# Patient Record
Sex: Male | Born: 1955 | Race: White | Hispanic: No | Marital: Married | Smoking: Never smoker
Health system: Southern US, Community
[De-identification: ages and names within clinical notes are randomized; demographics above are authoritative.]

## PROBLEM LIST (undated history)

## (undated) DIAGNOSIS — I700 Atherosclerosis of aorta: Secondary | ICD-10-CM

## (undated) DIAGNOSIS — E7841 Elevated Lipoprotein(a): Secondary | ICD-10-CM

## (undated) DIAGNOSIS — E782 Mixed hyperlipidemia: Secondary | ICD-10-CM

## (undated) DIAGNOSIS — I10 Essential (primary) hypertension: Secondary | ICD-10-CM

## (undated) HISTORY — DX: Essential (primary) hypertension: I10

## (undated) HISTORY — DX: Mixed hyperlipidemia: E78.2

## (undated) HISTORY — DX: Atherosclerosis of aorta: I70.0

## (undated) HISTORY — DX: Elevated lipoprotein(a): E78.41

## (undated) HISTORY — PX: REPAIR KNEE LIGAMENT: SUR1188

---

## 2016-05-06 ENCOUNTER — Other Ambulatory Visit: Payer: Self-pay | Admitting: Emergency Medicine

## 2016-05-06 ENCOUNTER — Emergency Department (HOSPITAL_COMMUNITY)
Admission: EM | Admit: 2016-05-06 | Discharge: 2016-05-06 | Disposition: A | Discharge status: Home / Self Care | Payer: Self-pay | Attending: Emergency Medicine | Admitting: Emergency Medicine

## 2016-05-06 ENCOUNTER — Ambulatory Visit (INDEPENDENT_AMBULATORY_CARE_PROVIDER_SITE_OTHER): Payer: Self-pay | Admitting: Emergency Medicine

## 2016-05-06 ENCOUNTER — Encounter (HOSPITAL_COMMUNITY): Payer: Self-pay | Admitting: Vascular Surgery

## 2016-05-06 ENCOUNTER — Ambulatory Visit (INDEPENDENT_AMBULATORY_CARE_PROVIDER_SITE_OTHER): Payer: Self-pay

## 2016-05-06 VITALS — BP 154/94 | HR 65 | Temp 98.1°F | Resp 18 | Ht 69.0 in | Wt 182.8 lb

## 2016-05-06 DIAGNOSIS — I10 Essential (primary) hypertension: Secondary | ICD-10-CM

## 2016-05-06 DIAGNOSIS — Z1159 Encounter for screening for other viral diseases: Secondary | ICD-10-CM

## 2016-05-06 DIAGNOSIS — R142 Eructation: Secondary | ICD-10-CM

## 2016-05-06 DIAGNOSIS — R9431 Abnormal electrocardiogram [ECG] [EKG]: Secondary | ICD-10-CM

## 2016-05-06 DIAGNOSIS — Z13220 Encounter for screening for lipoid disorders: Secondary | ICD-10-CM

## 2016-05-06 DIAGNOSIS — R0602 Shortness of breath: Secondary | ICD-10-CM

## 2016-05-06 LAB — BASIC METABOLIC PANEL
ANION GAP: 8 (ref 5–15)
BUN: 10 mg/dL (ref 6–20)
CO2: 26 mmol/L (ref 22–32)
Calcium: 9.3 mg/dL (ref 8.9–10.3)
Chloride: 104 mmol/L (ref 101–111)
Creatinine, Ser: 0.99 mg/dL (ref 0.61–1.24)
GFR calc Af Amer: >60 mL/min (ref >60)
Glucose, Bld: 103 mg/dL — ABNORMAL HIGH (ref 65–99)
POTASSIUM: 3.9 mmol/L (ref 3.5–5.1)
SODIUM: 138 mmol/L (ref 135–145)

## 2016-05-06 LAB — I-STAT TROPONIN, ED
Troponin i, poc: 0.01 ng/mL (ref 0.00–0.08)
Troponin i, poc: 0.01 ng/mL (ref 0.00–0.08)

## 2016-05-06 LAB — POCT CBC
GRANULOCYTE PERCENT: 72.2 % (ref 37–80)
HCT, POC: 45.3 % (ref 43.5–53.7)
Hemoglobin: 16.5 g/dL (ref 14.1–18.1)
LYMPH, POC: 1.5 (ref 0.6–3.4)
MCH, POC: 33.8 pg — AB (ref 27–31.2)
MCHC: 36.4 g/dL — AB (ref 31.8–35.4)
MCV: 93 fL (ref 80–97)
MID (CBC): 0.2 (ref 0–0.9)
MPV: 6.9 fL (ref 0–99.8)
PLATELET COUNT, POC: 225 10*3/uL (ref 142–424)
POC Granulocyte: 4.4 (ref 2–6.9)
POC LYMPH %: 24.7 % (ref 10–50)
POC MID %: 3.1 %M (ref 0–12)
RBC: 4.87 M/uL (ref 4.69–6.13)
RDW, POC: 12.4 %
WBC: 6.1 10*3/uL (ref 4.6–10.2)

## 2016-05-06 LAB — CBC
HEMATOCRIT: 46.7 % (ref 39.0–52.0)
HEMOGLOBIN: 16.2 g/dL (ref 13.0–17.0)
MCH: 32.7 pg (ref 26.0–34.0)
MCHC: 34.7 g/dL (ref 30.0–36.0)
MCV: 94.2 fL (ref 78.0–100.0)
Platelets: 208 10*3/uL (ref 150–400)
RBC: 4.96 MIL/uL (ref 4.22–5.81)
RDW: 11.9 % (ref 11.5–15.5)
WBC: 6 10*3/uL (ref 4.0–10.5)

## 2016-05-06 LAB — GLUCOSE, POCT (MANUAL RESULT ENTRY): POC Glucose: 85 mg/dl (ref 70–99)

## 2016-05-06 MED ORDER — AMLODIPINE BESYLATE 2.5 MG PO TABS: 5.0000 mg | ORAL_TABLET | Freq: Every day | ORAL | Status: DC

## 2016-05-06 MED ORDER — RANITIDINE HCL 150 MG PO CAPS: 150.0000 mg | ORAL_CAPSULE | Freq: Every day | ORAL | Status: DC

## 2016-05-06 NOTE — ED Notes (Signed)
Pt reports to the ED for eval of SOB and hypertension. Symptom onset this am. [facility] and [facility]. Pt also reports some chest pressure and increased belching yesterday. Pt denies any hx of HTN. 12 lead en route showed inverted T-waves in leads V2-V5. Pt has had 2- 325 ASA at 730 this am. Pt A&Ox4, resp e/u, and skin warm and dry.

## 2016-05-06 NOTE — ED Notes (Signed)
Placed patient into a gown and on the monitor 

## 2016-05-06 NOTE — ED Provider Notes (Signed)
CSN: [id number]     Arrival date & time 05/06/16  1253 History   First MD Initiated Contact with Patient 05/06/16 1323     Chief Complaint  Patient presents with  . Shortness of Breath  . Hypertension     (Consider location/radiation/quality/duration/timing/severity/associated sxs/prior Treatment) HPI   Johnathan [name] is a [age] year old male with no significant past medical history who presents the ED today complaining of elevated blood pressure and epigastric pressure. Patient states that for the last 2-3 months he has noted home BP measurements to be higher than normal, and the range of 130s to 140s over 90s. In the last week patient has taken his blood pressure and it has been higher than that approximately 150s to 160s over 100. Yesterday patient states he was eating fruits that he does not normally eat and had a five-minute episode of pressure in his epigastrium. He subsequently developed belching which continued throughout the night and into this morning. He checked his blood pressure this morning and it was elevated to systolic 180. He went to the urgent care and was sent to the ED for further evaluation. He reports significant increase in life stressors in the last 3 months. He can tell that his blood pressure goes up when he feels stressed. He denies any chest pain, nausea, shortness of breath, diaphoresis, dizziness. No tobacco use. Positive family history for cardiac risk factors and his mother. No history of diabetes. Patient is very active and is otherwise healthy.  History reviewed. No pertinent past medical history. Past Surgical History  Procedure Laterality Date  . Repair knee ligament     Family History  Problem Relation Age of Onset  . Hypertension Mother   . Cancer Father   . Hypertension Paternal Grandfather    Social History  Substance Use Topics  . Smoking status: Never Smoker   . Smokeless tobacco: None  . Alcohol Use: 0.0 oz/week    0 Standard drinks or  equivalent per week    Review of Systems  All other systems reviewed and are negative.     Allergies  Review of patient's allergies indicates no known allergies.  Home Medications   Prior to Admission medications   Not on File   BP 166/111 mmHg  Pulse 60  Temp(Src) 98.6 F (37 C) (Oral)  Resp 21  SpO2 99% Physical Exam  Constitutional: He is oriented to person, place, and time. He appears well-developed and well-nourished. No distress.  HENT:  Head: Normocephalic and atraumatic.  Mouth/Throat: No oropharyngeal exudate.  Eyes: Conjunctivae and EOM are normal. Pupils are equal, round, and reactive to light. Right eye exhibits no discharge. Left eye exhibits no discharge. No scleral icterus.  Cardiovascular: Normal rate, regular rhythm, normal heart sounds and intact distal pulses.  Exam reveals no gallop and no friction rub.   No murmur heard. Pulmonary/Chest: Effort normal and breath sounds normal. No respiratory distress. He has no wheezes. He has no rales. He exhibits no tenderness.  Abdominal: Soft. Bowel sounds are normal. He exhibits no distension and no mass. There is no tenderness. There is no rebound and no guarding.  Musculoskeletal: Normal range of motion. He exhibits no edema.  Neurological: He is alert and oriented to person, place, and time.  Skin: Skin is warm and dry. No rash noted. He is not diaphoretic. No erythema. No pallor.  Psychiatric: He has a normal mood and affect. His behavior is normal.  Nursing note and vitals reviewed.   ED Course  Procedures (including critical care time) Labs Review Labs Reviewed  BASIC METABOLIC PANEL - Abnormal; Notable for the following:    Glucose, Bld 103 (*)    All other components within normal limits  CBC  I-STAT TROPOININ, ED    Imaging Review Dg Chest 2 View  05/06/2016  CLINICAL DATA:  Chest heaviness and pressure sensation while at the [facility] yesterday; nonsmoker. EXAM: CHEST  2 VIEW COMPARISON:  None in  PACs FINDINGS: The lungs are adequately inflated and clear. The heart and pulmonary vascularity are normal. The mediastinum is normal in width. There is calcification in the wall of the aortic arch. There is no pleural effusion. The bony thorax is unremarkable. IMPRESSION: 1. There is no active cardiopulmonary disease. 2. Aortic atherosclerosis. Electronically Signed   By: David  [place] M.D.   On: 05/06/2016 11:32   Dg Abd 2 Views  05/06/2016  CLINICAL DATA:  [place] of [place]. EXAM: ABDOMEN - 2 VIEW COMPARISON:  None in PACs FINDINGS: The bowel gas pattern is normal. There are no abnormal soft tissue calcifications. There are phleboliths within the pelvis. The bony structures are unremarkable. IMPRESSION: No calcified gallstones are observed. No acute intra-abdominal abnormality is demonstrated. Electronically Signed   By: David  [place] M.D.   On: 05/06/2016 11:33   I have personally reviewed and evaluated these images and lab results as part of my medical decision-making.   EKG Interpretation   Date/Time:  Monday May 06 2016 17:38:59 EDT Ventricular Rate:  59 PR Interval:    QRS Duration: 113 QT Interval:  430 QTC Calculation: 426 R Axis:   55 Text Interpretation:  Sinus rhythm Borderline intraventricular conduction  delay Abnormal T, consider ischemia, anterior leads No significant change  since last tracing Confirmed by Kandis Mannan ([id number]) on 05/06/2016  5:41:37 PM Also confirmed by Corlis Leak, COURTNEY ([id number]), editor [place]   CCT, BEVERLY ([id number])  on 05/07/2016 7:02:09 AM      MDM   Final diagnoses:  Essential hypertension  Abnormal EKG    60 year old male with no known past medical history presents to the ED today with complaints of ongoing intermittent elevated blood pressure and one episode of epigastric pressure that occurred yesterday after eating fruits with associated belching. On presentation to ED  patient appears well and is currently symptom free. He is hypertensive in the ED with a blood pressure of 176/113. EKG reveals inverted T waves in leads V1 through V4. Patient has no prior EKG to compare. Unknown if this is new for patient initially troponin is 0. All other lab work is within normal limits. Had in depth discussion with patient with regards to admitting for observation given EKG findings. However patient's story is concerning for ACS. Patient does not wish to stay in the hospital and will follow-up with his primary care doctor tomorrow. Repeat EKG and troponin after 3 hours were unchanged. Pt will be discharged with Rx for Norvasc and zantac. He will follow up with PCP and cardiologist this week. Pt appears reliable for follow up. Return precautions outlined in patient discharge instructions.   Patient was discussed with and seen by Dr. Dalene Seltzer who agrees with the treatment plan.     Lester Kinsman [place], PA-C 05/07/16 1109  Alvira Monday, MD 05/13/16 1316

## 2016-05-06 NOTE — Patient Instructions (Signed)
     IF you received an x-ray today, you will receive an invoice from [facility]. Please contact [facility] at [phone] with questions or concerns regarding your invoice.   IF you received labwork today, you will receive an invoice from [facility]. Please contact [facility] at [phone] with questions or concerns regarding your invoice.   Our billing staff will not be able to assist you with questions regarding bills from these companies.  You will be contacted with the lab results as soon as they are available. The fastest way to get your results is to activate your My Chart account. Instructions are located on the last page of this paperwork. If you have not heard from us regarding the results in 2 weeks, please contact this office.      

## 2016-05-06 NOTE — Discharge Instructions (Signed)
Hypertension Hypertension, commonly called high blood pressure, is when the force of blood pumping through your arteries is too strong. Your arteries are the blood vessels that carry blood from your heart throughout your body. A blood pressure reading consists of a higher number over a lower number, such as 110/72. The higher number (systolic) is the pressure inside your arteries when your heart pumps. The lower number (diastolic) is the pressure inside your arteries when your heart relaxes. Ideally you want your blood pressure below 120/80. Hypertension forces your heart to work harder to pump blood. Your arteries may become narrow or stiff. Having untreated or uncontrolled hypertension can cause heart attack, stroke, kidney disease, and other problems. RISK FACTORS Some risk factors for high blood pressure are controllable. Others are not.  Risk factors you cannot control include:   Race. You may be at higher risk if you are African American.  Age. Risk increases with age.  Gender. Men are at higher risk than women before age 45 years. After age 65, women are at higher risk than men. Risk factors you can control include:  Not getting enough exercise or physical activity.  Being overweight.  Getting too much fat, sugar, calories, or salt in your diet.  Drinking too much alcohol. SIGNS AND SYMPTOMS Hypertension does not usually cause signs or symptoms. Extremely high blood pressure (hypertensive crisis) may cause headache, anxiety, shortness of breath, and nosebleed. DIAGNOSIS To check if you have hypertension, your health care provider will measure your blood pressure while you are seated, with your arm held at the level of your heart. It should be measured at least twice using the same arm. Certain conditions can cause a difference in blood pressure between your right and left arms. A blood pressure reading that is higher than normal on one occasion does not mean that you need treatment. If  it is not clear whether you have high blood pressure, you may be asked to return on a different day to have your blood pressure checked again. Or, you may be asked to monitor your blood pressure at home for 1 or more weeks. TREATMENT Treating high blood pressure includes making lifestyle changes and possibly taking medicine. Living a healthy lifestyle can help lower high blood pressure. You may need to change some of your habits. Lifestyle changes may include:  Following the DASH diet. This diet is high in fruits, vegetables, and whole grains. It is low in salt, red meat, and added sugars.  Keep your sodium intake below 2,300 mg per day.  Getting at least 30-45 minutes of aerobic exercise at least 4 times per week.  Losing weight if necessary.  Not smoking.  Limiting alcoholic beverages.  Learning ways to reduce stress. Your health care provider may prescribe medicine if lifestyle changes are not enough to get your blood pressure under control, and if one of the following is true:  You are 18-59 years of age and your systolic blood pressure is above 140.  You are 60 years of age or older, and your systolic blood pressure is above 150.  Your diastolic blood pressure is above 90.  You have diabetes, and your systolic blood pressure is over 140 or your diastolic blood pressure is over 90.  You have kidney disease and your blood pressure is above 140/90.  You have heart disease and your blood pressure is above 140/90. Your personal target blood pressure may vary depending on your medical conditions, your age, and other factors. HOME CARE INSTRUCTIONS    Have your blood pressure rechecked as directed by your health care provider.   Take medicines only as directed by your health care provider. Follow the directions carefully. Blood pressure medicines must be taken as prescribed. The medicine does not work as well when you skip doses. Skipping doses also puts you at risk for  problems.  Do not smoke.   Monitor your blood pressure at home as directed by your health care provider. SEEK MEDICAL CARE IF:   You think you are having a reaction to medicines taken.  You have recurrent headaches or feel dizzy.  You have swelling in your ankles.  You have trouble with your vision. SEEK IMMEDIATE MEDICAL CARE IF:  You develop a severe headache or confusion.  You have unusual weakness, numbness, or feel faint.  You have severe chest or abdominal pain.  You vomit repeatedly.  You have trouble breathing. MAKE SURE YOU:   Understand these instructions.  Will watch your condition.  Will get help right away if you are not doing well or get worse.   This information is not intended to replace advice given to you by your health care provider. Make sure you discuss any questions you have with your health care provider.   Take Norvasc for high blood pressure. You will need follow up with your primary care provider to further manage your blood pressure. Also, please schedule an appointment with cardiology to address EKG changes. Return to the ED if you experience severe worsening of your symptoms, increased chest pain, difficulty breathing, severe headache, blurry vision, loss of consciousness, numbness or tingling in your extremities.

## 2016-05-06 NOTE — Progress Notes (Signed)
By signing my name below, I, Raven Small, attest that this documentation has been prepared under the direction and in the presence of Lesle ChrisSteven Ervin Hensley, MD.  Electronically Signed: Andrew Auaven Small, ED Scribe. 05/06/2016. 10:59 AM  Chief Complaint:  Chief Complaint  Patient presents with  . Blood pressure running high   HPI: Johnathan [name] is a 60 y.o. male [facility] complaining of hypertension. Pt believes BP has been elevated for the past 2-3 months. Pt states there has been a lot of stress at home with his mother [place]heaviness in diaphragm. He notes belching yesterday after eating "strange"/ new foods and fruit yesterday. He reports some relief to pressure after belching as well as taking an aspirin. BP home reading has been 150/81.  Pt is not a smoker. He is not on medication. He mother 1089 y.o. with hx of HTN, anxiety and a Visual merchandiserpace maker. Pt denies SOB, sweats and faint with episode yesterday.  History reviewed. No pertinent past medical history. Past Surgical History  Procedure Laterality Date  . Repair knee ligament     Social History   Social History  . Marital Status: Married    Spouse Name: N/A  . Number of Children: N/A  . Years of Education: N/A   Social History Main Topics  . Smoking status: Never Smoker   . Smokeless tobacco: None  . Alcohol Use: 0.0 oz/week    0 Standard drinks or equivalent per week  . Drug Use: None  . Sexual Activity: Not Asked   Other Topics Concern  . None   Social History Narrative  . None   Family History  Problem Relation Age of Onset  . Hypertension Mother   . Cancer Father   . Hypertension Paternal Grandfather    No Known Allergies Prior to Admission medications   Not on File     ROS: The patient denies fevers, chills, night sweats, unintentional weight loss, palpitations, wheezing, dyspnea on exertion, nausea, vomiting, abdominal pain, dysuria, hematuria,  melena, numbness, weakness, or tingling.   All other systems have been reviewed and were otherwise negative with the exception of those mentioned in the HPI and as above.    PHYSICAL EXAM: Filed Vitals:   05/06/16 1012  BP: 154/94  Pulse: 65  Temp: 98.1 F (36.7 C)  Resp: 18  Right arm -181/106 Left arm- 184/106 Body mass index is 26.98 kg/(m^2).   General: Alert, no acute distress HEENT:  Normocephalic, atraumatic, oropharynx patent. Eye: Nonie HoyerOMI, [facility] Cardiovascular:  Regular rate and rhythm, no rubs murmurs or gallops.  No Carotid bruits, radial pulse intact. No pedal edema.  Respiratory: Clear to auscultation bilaterally.  No wheezes, rales, or rhonchi.  No cyanosis, no use of accessory musculature Abdominal: No organomegaly, abdomen is soft and non-tender, positive bowel sounds.  No masses. Musculoskeletal: Gait intact. No edema, tenderness Skin: No rashes. Neurologic: Facial musculature symmetric. Psychiatric: Patient acts appropriately throughout our interaction. Lymphatic: No cervical or submandibular lymphadenopathy   LABS: Results for orders placed or performed in visit on 05/06/16  POCT CBC  Result Value Ref Range   WBC 6.1 4.6 - 10.2 K/uL   Lymph, poc 1.5 0.6 - 3.4   POC LYMPH PERCENT 24.7 10 - 50 %L   MID (cbc) 0.2 0 - 0.9   POC MID % 3.1 0 - 12 %M   POC Granulocyte 4.4 2 - 6.9   Granulocyte percent 72.2  37 - 80 %G   RBC 4.87 4.69 - 6.13 M/uL   Hemoglobin 16.5 14.1 - 18.1 g/dL   HCT, POC 16.1 09.6 - 53.7 %   MCV 93.0 80 - 97 fL   MCH, POC 33.8 (A) 27 - 31.2 pg   MCHC 36.4 (A) 31.8 - 35.4 g/dL   RDW, POC 04.5 %   Platelet Count, POC 225 142 - 424 K/uL   MPV 6.9 0 - 99.8 fL  POCT glucose (manual entry)  Result Value Ref Range   POC Glucose 85 70 - 99 mg/dl   EKG/XRAY:   Primary read interpreted by Dr. Cleta Alberts at [facility]. Dg Chest 2 View  05/06/2016  CLINICAL DATA:  Chest heaviness and pressure sensation while at the [facility] yesterday; nonsmoker. EXAM: CHEST   2 VIEW COMPARISON:  None in PACs FINDINGS: The lungs are adequately inflated and clear. The heart and pulmonary vascularity are normal. The mediastinum is normal in width. There is calcification in the wall of the aortic arch. There is no pleural effusion. The bony thorax is unremarkable. IMPRESSION: 1. There is no active cardiopulmonary disease. 2. Aortic atherosclerosis. Electronically Signed   By: David  [place] M.D.   On: 05/06/2016 11:32   Dg Abd 2 Views  05/06/2016  CLINICAL DATA:  [place] of [place]. EXAM: ABDOMEN - 2 VIEW COMPARISON:  None in PACs FINDINGS: The bowel gas pattern is normal. There are no abnormal soft tissue calcifications. There are phleboliths within the pelvis. The bony structures are unremarkable. IMPRESSION: No calcified gallstones are observed. No acute intra-abdominal abnormality is demonstrated. Electronically Signed   By: David  [place] M.D.   On: 05/06/2016 11:33   EKG there are T-wave changes across the precordium.  ASSESSMENT/PLAN: Patient presents with hypertensive urgency as well as a significantly abnormal EKG. He is sent to the hospital via EMS for evaluation. He will need thorough cardiac evaluation in the near future and probable GI evaluation also.I personally performed the services described in this documentation, which was scribed in my presence. The recorded information has been reviewed and is accurate. He was given 2 aspirin in the office.   Gross sideeffects, risk and benefits, and alternatives of medications d/w patient. Patient is aware that all medications have potential sideeffects and we are unable to predict every sideeffect or drug-drug interaction that may occur.  Lesle Kaiea MD 05/06/2016 10:59 AM

## 2016-05-07 ENCOUNTER — Other Ambulatory Visit: Payer: Self-pay | Admitting: Emergency Medicine

## 2016-05-07 DIAGNOSIS — R9431 Abnormal electrocardiogram [ECG] [EKG]: Secondary | ICD-10-CM

## 2016-05-07 DIAGNOSIS — E785 Hyperlipidemia, unspecified: Secondary | ICD-10-CM

## 2016-05-07 LAB — LIPID PANEL
Cholesterol: 275 mg/dL — ABNORMAL HIGH (ref 125–200)
HDL: 60 mg/dL (ref >=40)
LDL Cholesterol: 188 mg/dL — ABNORMAL HIGH (ref <130)
TRIGLYCERIDES: 133 mg/dL (ref <150)
Total CHOL/HDL Ratio: 4.6 Ratio (ref <=5.0)
VLDL: 27 mg/dL (ref <30)

## 2016-05-07 LAB — COMPLETE METABOLIC PANEL WITH GFR
ALT: 32 U/L (ref 9–46)
AST: 28 U/L (ref 10–35)
Albumin: 4.9 g/dL (ref 3.6–5.1)
Alkaline Phosphatase: 71 U/L (ref 40–115)
BUN: 12 mg/dL (ref 7–25)
CHLORIDE: 101 mmol/L (ref 98–110)
CO2: 24 mmol/L (ref 20–31)
CREATININE: 1.01 mg/dL (ref 0.70–1.33)
Calcium: 9.5 mg/dL (ref 8.6–10.3)
GFR, EST NON AFRICAN AMERICAN: 82 mL/min (ref >=60)
GFR, Est African American: >89 mL/min (ref >=60)
Glucose, Bld: 97 mg/dL (ref 65–99)
Potassium: 4.9 mmol/L (ref 3.5–5.3)
Sodium: 139 mmol/L (ref 135–146)
Total Bilirubin: 0.7 mg/dL (ref 0.2–1.2)
Total Protein: 7.4 g/dL (ref 6.1–8.1)

## 2016-05-07 LAB — HEPATITIS C ANTIBODY: HCV Ab: NEGATIVE

## 2016-05-07 MED ORDER — ASPIRIN EC 325 MG PO TBEC: 325.0000 mg | DELAYED_RELEASE_TABLET | Freq: Every day | ORAL | Status: AC

## 2016-05-07 MED ORDER — ROSUVASTATIN CALCIUM 10 MG PO TABS: 10.0000 mg | ORAL_TABLET | Freq: Every day | ORAL | Status: AC

## 2016-05-17 ENCOUNTER — Other Ambulatory Visit: Payer: Self-pay

## 2016-05-21 ENCOUNTER — Telehealth: Payer: Self-pay | Admitting: Emergency Medicine

## 2016-05-21 NOTE — Telephone Encounter (Signed)
-----   Message from Collene Gobble, MD sent at 05/07/2016  7:32 AM EDT ----- Please call. His cholesterol is 275 with an LDL of 188. This is significantly abnormal. Please call in Crestor 10 mg 1 at night he can have #30 pills refilled for 1 year. He needs a blood pressure check in one week.. Do they need me to place the referral to cardiology for further evaluation? I have also said put in a request for an ultrasound of the abdomen. Please double check and be sure this is in the system. He needs a blood pressure check in one week. Also please add a PSA to bloodwork at the lab. Associate this with prostate cancer screening.

## 2016-10-29 ENCOUNTER — Ambulatory Visit (INDEPENDENT_AMBULATORY_CARE_PROVIDER_SITE_OTHER): Payer: Self-pay | Admitting: Orthopaedic Surgery

## 2016-10-29 ENCOUNTER — Encounter (INDEPENDENT_AMBULATORY_CARE_PROVIDER_SITE_OTHER): Payer: Self-pay

## 2016-10-29 ENCOUNTER — Ambulatory Visit (INDEPENDENT_AMBULATORY_CARE_PROVIDER_SITE_OTHER): Payer: Self-pay

## 2016-10-29 ENCOUNTER — Encounter (INDEPENDENT_AMBULATORY_CARE_PROVIDER_SITE_OTHER): Payer: Self-pay | Admitting: Orthopaedic Surgery

## 2016-10-29 VITALS — BP 137/91 | HR 75 | Ht 69.0 in | Wt 174.5 lb

## 2016-10-29 DIAGNOSIS — M20012 Mallet finger of left finger(s): Secondary | ICD-10-CM

## 2016-10-29 DIAGNOSIS — M79645 Pain in left finger(s): Secondary | ICD-10-CM

## 2016-10-29 MED ORDER — DOXYCYCLINE HYCLATE 100 MG PO TABS: 100.0000 mg | ORAL_TABLET | Freq: Two times a day (BID) | ORAL | Status: AC

## 2016-10-29 NOTE — Progress Notes (Signed)
Office Visit Note   Patient: Johnathan [name]           Date of Birth: 02/16/1956           MRN: [id number] Visit Date: 10/29/2016              Requested by: No referring provider defined for this encounter. PCP: Lucilla EdinAUB, Johnathan [name], Johnathan [name]   Assessment & Plan: Visit Diagnoses:  1. Pain of left middle finger   2. Mallet finger of left finger(s)     Plan: We'll put him for some the possible foreign body or superficial infection with some doxycycline 100 mg by mouth twice [name] day 7 days. Stack splint applied he'll keep it on all the time for 10 weeks. I'll recheck him in 3 weeks to check the dorsal skin. We discussed the mallet deformity that present in the tendon injury.  Follow-Up Instructions: Return in about 3 years (around 10/30/2019).   Orders:  Orders Placed This Encounter  Procedures  . XR Finger Middle Left   Meds ordered this encounter  Medications  . doxycycline (VIBRA-TABS) tablet 100 mg      Procedures: No procedures performed   Clinical Data: No additional findings.   Subjective: Chief Complaint  Patient presents with  . Left Middle Finger - Pain    Injured right middle finger on 08/31/2016 while reaching down to pick up paddle for stew. He jammed finger on board that was underneath it. He states that he thinks shards of bone have been coming out of it. He has been taping it. It is swollen today.   patient had taped in extension with [name] spoon and some therapy tape. He stopped this after [name] couple weeks.  Review of Systems  Constitutional: Negative for chills and diaphoresis.  HENT: Negative for ear discharge, ear pain and nosebleeds.   Eyes: Negative for discharge and visual disturbance.  Respiratory: Negative for cough, choking and shortness of breath.   Cardiovascular: Negative for chest pain and palpitations.  Gastrointestinal: Negative for abdominal distention and abdominal pain.  Endocrine: Negative for cold intolerance and heat intolerance.    Genitourinary: Negative for flank pain and hematuria.  Musculoskeletal:       Negative for previous finger problems besides [name] little bit of stiffness PIP joint index finger which is chronic  Skin: Negative for rash and wound.  Neurological: Negative for seizures and speech difficulty.  Hematological: Negative for adenopathy. Does not bruise/bleed easily.  Psychiatric/Behavioral: Negative for agitation and suicidal ideas.     Objective: Vital Signs: BP (!) 137/91   Pulse 75   Ht 5\' 9"  (1.753 m)   Wt 174 lb 8 oz (79.2 kg)   BMI 25.77 kg/m   Physical Exam  Constitutional: He is oriented to person, place, and time. He appears well-developed and well-nourished.  HENT:  Head: Normocephalic and atraumatic.  Eyes: EOM are normal. Pupils are equal, round, and reactive to light.  Neck: No tracheal deviation present. No thyromegaly present.  Cardiovascular: Normal rate.   Pulmonary/Chest: Effort normal. He has no wheezes.  Abdominal: Soft. Bowel sounds are normal.  Musculoskeletal:  Patient has some erythema over the middle phalanx. Small area of the open laceration when he had slight purulent drainage several weeks ago is not completely healed. No true cellulitis. There is [name] extension lag at the DIP joint consistent with mallet finger.  Neurological: He is alert and oriented to person, place, and time.  Skin: Skin is warm and dry. Capillary  refill takes less than 2 seconds.  Psychiatric: He has [name] normal mood and affect. His behavior is normal. Judgment and thought content normal.    Ortho Exam two point sensation is intact profundus is intact to the long finger. Collateral ligaments are stable. No purulence is expressed there were the had the small 2 mm laceration which is closer to the PIP joint and DIP joint. He described deformity of the finger that he had to manually reduce at the DIP joint and then taped up the tape with [name] pieces and keep his finger extended for [name] couple weeks and  since that time is noted that it started to drift down in flexion with inability to extend the finger with extension lag.  Specialty Comments:  No specialty comments available.  Imaging: No results found.   PMFS History: Patient Active Problem List   Diagnosis Date Noted  . Mallet finger of left finger(s) 10/29/2016  . Nonspecific abnormal electrocardiogram (ECG) (EKG) 05/06/2016   No past medical history on file.  Family History  Problem Relation Age of Onset  . Hypertension Mother   . Cancer Father   . Hypertension Paternal Grandfather     Past Surgical History:  Procedure Laterality Date  . REPAIR KNEE LIGAMENT     Social History   Occupational History  . Not on file.   Social History Main Topics  . Smoking status: Never Smoker  . Smokeless tobacco: Not on file  . Alcohol use 0.0 oz/week  . Drug use: Unknown  . Sexual activity: Not on file

## 2018-06-09 IMAGING — DX DG CHEST 2V
2 series · 2 of 2 positions shown · non-contrast
Comparison: None in PACs

CLINICAL DATA: Chest heaviness and pressure sensation while at the
[employer] yesterday; nonsmoker.

EXAM:
CHEST  2 VIEW

[chest pa]
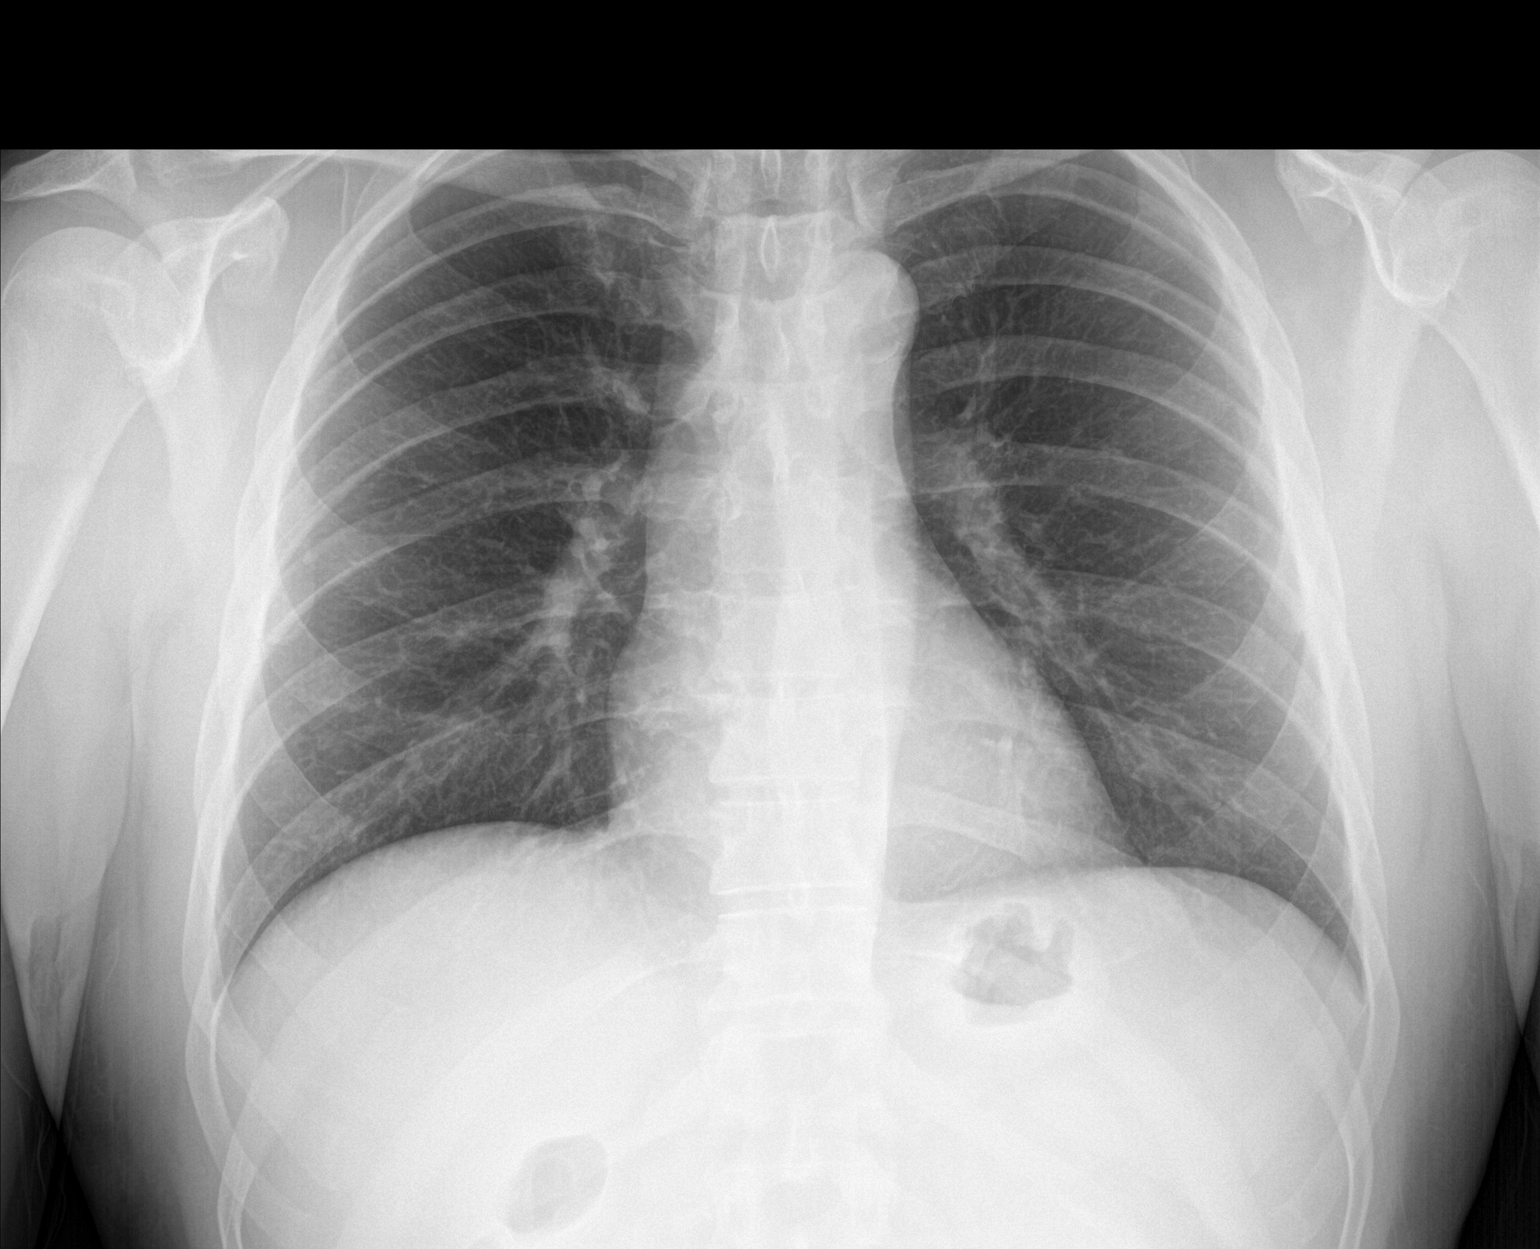

[chest lat]
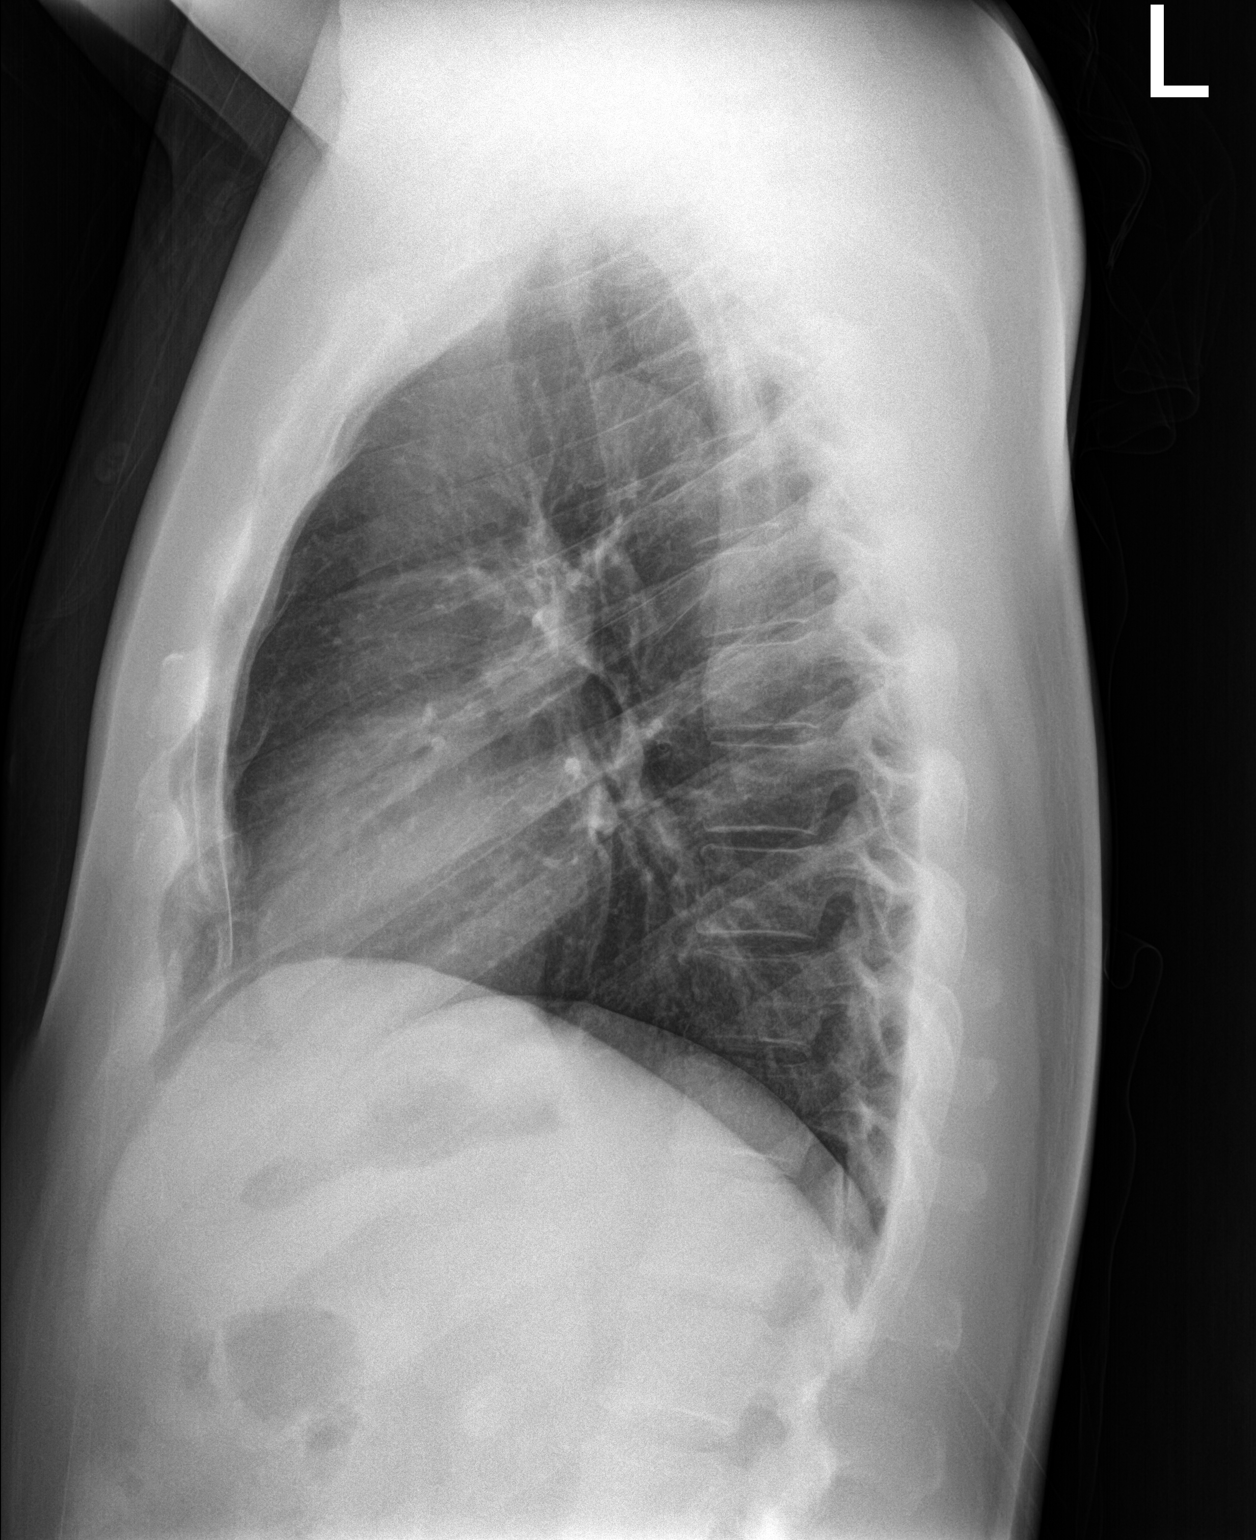

[2 of 2 positions shown; findings below may reference images not displayed]

FINDINGS: The lungs are adequately inflated and clear. The heart and pulmonary
vascularity are normal. The mediastinum is normal in width. There is
calcification in the wall of the aortic arch. There is no pleural
effusion. The bony thorax is unremarkable.
IMPRESSION: 1. There is no active cardiopulmonary disease.
2. Aortic atherosclerosis.

## 2019-07-28 ENCOUNTER — Encounter: Payer: Self-pay | Admitting: Orthopaedic Surgery

## 2019-07-28 ENCOUNTER — Ambulatory Visit (INDEPENDENT_AMBULATORY_CARE_PROVIDER_SITE_OTHER): Payer: Self-pay | Admitting: Orthopaedic Surgery

## 2019-07-28 DIAGNOSIS — S68119A Complete traumatic metacarpophalangeal amputation of unspecified finger, initial encounter: Secondary | ICD-10-CM | POA: Insufficient documentation

## 2019-07-28 MED ORDER — OXYCODONE HCL 5 MG PO TABS: 5.0000 mg | ORAL_TABLET | Freq: Four times a day (QID) | ORAL | 0 refills | Status: DC | PRN

## 2019-07-28 MED ORDER — IBUPROFEN 800 MG PO TABS: 800.0000 mg | ORAL_TABLET | Freq: Three times a day (TID) | ORAL | 1 refills | Status: AC | PRN

## 2019-07-28 NOTE — Progress Notes (Signed)
Office Visit Note   Patient: Johnathan [name]           Date of Birth: December 21, 1955           MRN: [id number] Visit Date: 07/28/2019              Requested by: No referring provider defined for this encounter. PCP: Patient, No Pcp Per   Assessment & Plan: Visit Diagnoses:  1. Traumatic amputation of finger, initial encounter     Plan: I did give him reassurance that the appropriate care was rendered to his left hand given the nature of his amputation and he was pleased to hear this.  I am going to have him get his finger wet daily in the shower and then he will dried and placed Xeroform and a new dressing over the wound daily.  We will see him back in 2 weeks for suture removal.  All question concerns were answered addressed.  I will send in some oxycodone and 800 mg ibuprofen.  Follow-Up Instructions: Return in about 2 weeks (around 08/11/2019).   Orders:  No orders of the defined types were placed in this encounter.  Meds ordered this encounter  Medications   oxyCODONE (ROXICODONE) 5 MG immediate release tablet    Sig: Take 1-2 tablets (5-10 mg total) by mouth every 6 (six) hours as needed for severe pain.    Dispense:  40 tablet    Refill:  0   ibuprofen (ADVIL) 800 MG tablet    Sig: Take 1 tablet (800 mg total) by mouth every 8 (eight) hours as needed.    Dispense:  60 tablet    Refill:  1      Procedures: No procedures performed   Clinical Data: No additional findings.   Subjective: Chief Complaint  Patient presents with   Left Little Finger - Pain  Patient comes in today a week and a day out from a traumatic injury to his left nondominant hand.  He was working with some type of farming equipment and he sustained a traumatic amputation to his left fifth finger at the level of the DIP joint.  He showed me the actual photographs of his finger.  He was seen at the [facility] emergency room.  I was able to review the notes and see that they did  appropriately perform a revision amputation at the level of the middle phalanx.  I gave him reassurance that this is appropriate treatment for this type of injury that there is no option for reattachment given the extent of the injury and the area of the zone of injury which prohibits reattachment.  He has been keeping a dressing on his finger.  His wife is a Engineer, civil (consulting).  He is having some pain and hydrocodone is not been helping.  He is right-hand dominant.  He is well-known to our office.  HPI  Review of Systems He currently denies any headache, chest pain, shortness of breath, fever, chills, nausea, vomiting  Objective: Vital Signs: There were no vitals taken for this visit.  Physical Exam He is alert and orient x3 and in no acute distress Ortho Exam Examination of his left hand shows a traumatic amputation of the fifth finger with a revision amputation and closure with a nice flap at the middle phalanx.  Overall the wound looks excellent and there is no evidence of infection. Specialty Comments:  No specialty comments available.  Imaging: No results found.   PMFS History: Patient Active  Problem List   Diagnosis Date Noted   Amputation, finger, traumatic 07/28/2019   Mallet finger of left finger(s) 10/29/2016   Nonspecific abnormal electrocardiogram (ECG) (EKG) 05/06/2016   History reviewed. No pertinent past medical history.  Family History  Problem Relation Age of Onset   Hypertension Mother    Cancer Father    Hypertension Paternal Grandfather     Past Surgical History:  Procedure Laterality Date   REPAIR KNEE LIGAMENT     Social History   Occupational History   Not on file  Tobacco Use   Smoking status: Never Smoker  Substance and Sexual Activity   Alcohol use: Yes    Alcohol/week: 0.0 standard drinks   Drug use: Not on file   Sexual activity: Not on file

## 2019-08-11 ENCOUNTER — Encounter: Payer: Self-pay | Admitting: Orthopaedic Surgery

## 2019-08-11 ENCOUNTER — Other Ambulatory Visit: Payer: Self-pay | Admitting: Orthopaedic Surgery

## 2019-08-11 ENCOUNTER — Other Ambulatory Visit: Payer: Self-pay

## 2019-08-11 ENCOUNTER — Ambulatory Visit (INDEPENDENT_AMBULATORY_CARE_PROVIDER_SITE_OTHER): Payer: Self-pay | Admitting: Orthopaedic Surgery

## 2019-08-11 DIAGNOSIS — S68119A Complete traumatic metacarpophalangeal amputation of unspecified finger, initial encounter: Secondary | ICD-10-CM

## 2019-08-11 DIAGNOSIS — Z89422 Acquired absence of other left toe(s): Secondary | ICD-10-CM

## 2019-08-11 MED ORDER — OXYCODONE HCL 5 MG PO TABS: 5.0000 mg | ORAL_TABLET | Freq: Four times a day (QID) | ORAL | 0 refills | Status: DC | PRN

## 2019-08-11 NOTE — Progress Notes (Signed)
The patient is now 2 weeks status post revision amputation of his left fifth finger.  He does report significant finger pain but this is doing well overall.  On examination the contours of the tip of his finger look good.  There is some swelling but no evidence of infection.  I did remove a few of the absorbable sutures.  Overall he is heading in the right direction and I gave him reassurance that this is normal to have the pain he is experiencing with his fifth finger.  I did recommend outpatient hand therapy but he says he does not really need.  From a cosmetic standpoint I let him know that the flap looks good.  I would not revise anything from a soft tissue standpoint until he was further out from his initial injury because there is a good chance he does not need any further surgery to the finger at all and he understands this as well.  He will continue local padding and dressing for protection.  We will see him back in 4 weeks to see how he looks overall.  All question concerns were answered and addressed.  If he ends up needing more pain medication he will let us know.

## 2019-09-08 ENCOUNTER — Encounter: Payer: Self-pay | Admitting: Orthopaedic Surgery

## 2019-09-08 ENCOUNTER — Other Ambulatory Visit: Payer: Self-pay

## 2019-09-08 ENCOUNTER — Ambulatory Visit: Payer: Self-pay | Admitting: Orthopaedic Surgery

## 2019-09-08 DIAGNOSIS — S68119A Complete traumatic metacarpophalangeal amputation of unspecified finger, initial encounter: Secondary | ICD-10-CM

## 2019-09-08 MED ORDER — OXYCODONE HCL 5 MG PO TABS: 5.0000 mg | ORAL_TABLET | Freq: Four times a day (QID) | ORAL | 0 refills | Status: DC | PRN

## 2019-09-08 NOTE — Progress Notes (Signed)
HPI: Mr. Washko returns today follow-up of his left fifth finger.  He is now 6 weeks status post amputation revision of the left fifth finger.  Was done at [facility].  8 comes in today stating he believes he is still suture within the finger.  Is been trying to pull the suture out.  Has pain only whenever palpating the left finger surgical site.  Physical exam: Left fifth finger incisions well-healed no signs of infection.  No evidence of suture.  He does have some dry skin over the area.  Definite keloid formation.  Tenderness with palpation of this area.  No signs of infection.  Impression: 6 weeks status post left fifth finger revision amputation.  Plan: Discussed with him going to formal therapy to work on scar tissue mobilization and defers.  Therefore he will work on scar tissue mobilization on his own.  Recommended he try some Mederma over the scar.  Did ask for refill on his pain medication.  Questions encouraged and answered.  We will see him back in 4 weeks sooner if there is any questions concerns.

## 2020-02-18 ENCOUNTER — Other Ambulatory Visit: Payer: Self-pay

## 2020-02-18 ENCOUNTER — Ambulatory Visit: Payer: Self-pay | Admitting: Cardiology

## 2020-02-18 ENCOUNTER — Encounter: Payer: Self-pay | Admitting: Cardiology

## 2020-02-18 VITALS — BP 176/103 | HR 62 | Temp 96.6°F | Resp 17 | Ht 69.0 in | Wt 185.0 lb

## 2020-02-18 DIAGNOSIS — E782 Mixed hyperlipidemia: Secondary | ICD-10-CM

## 2020-02-18 DIAGNOSIS — E7841 Elevated Lipoprotein(a): Secondary | ICD-10-CM

## 2020-02-18 DIAGNOSIS — I700 Atherosclerosis of aorta: Secondary | ICD-10-CM

## 2020-02-18 DIAGNOSIS — I10 Essential (primary) hypertension: Secondary | ICD-10-CM

## 2020-02-18 MED ORDER — AMLODIPINE BESYLATE 5 MG PO TABS: 5.0000 mg | ORAL_TABLET | Freq: Every day | ORAL | 0 refills | Status: DC

## 2020-02-18 NOTE — Progress Notes (Signed)
Johnathan [name] Date of Birth: 21-Apr-1956 MRN: [id number] Primary Care Provider:Patient, No Pcp Per Former Cardiology Providers: Dr. Adrian Prows Primary Cardiologist: Rex Kras, DO (established care 02/18/2020)  Date: 02/19/20 Last Office Visit: 10/29/2017  Chief Complaint  Patient presents with  . Follow-up  . Medication Refill  . Hypertension    HPI  Johnathan [name] is a 64 y.o.  male who presents to the office with a chief complaint of " medication refills and blood pressure management." Patient's past medical history and cardiovascular risk factors include: Hyperlipidemia, history of elevated LP(a), hypertension.   Patient is accompanied by his wife at today's office visit.  Last seen in the office by Dr. Adrian Prows back in January 2019 and at that time he had asked follow-up closely for the management of his hypertension, hyperlipidemia and elevated LP(a).  Patient was lost to follow-up and comes today to reestablish care and to get medication refills.  Hypertension: Patient states that he has been out of his blood pressure pills for approximately 1 year.  His blood pressure at today's office visit is not at goal.  He states that he has whitecoat hypertension and that his blood pressures at home are very well controlled.  Patient's wife who accompanies him today is a Marine scientist and states that his systolic blood pressures at home are usually less than 140 mmHg ehile on.  This morning it was 138/86.   Patient carries a history of hyperlipidemia and in 2017 his LP(a) was 315, Apo A within normal limits, Apo B elevated at 145 per past office notes.  Denies prior history of coronary artery disease, myocardial infarction, congestive heart failure, deep venous thrombosis, pulmonary embolism, stroke, transient ischemic attack.  FUNCTIONAL STATUS: He is active for age which includes golfing, fishing, and cuts trees if needed.   ALLERGIES: No Known Allergies   MEDICATION LIST PRIOR TO  VISIT: Current Outpatient Medications on File Prior to Visit  Medication Sig Dispense Refill  . aspirin EC 325 MG tablet Take 1 tablet (325 mg total) by mouth daily. (Patient not taking: Reported on 02/18/2020) 30 tablet 11  . ibuprofen (ADVIL) 800 MG tablet Take 1 tablet (800 mg total) by mouth every 8 (eight) hours as needed. (Patient not taking: Reported on 02/18/2020) 60 tablet 1  . niacin 500 MG CR capsule Take 500 mg by mouth at bedtime.    . rosuvastatin (CRESTOR) 10 MG tablet Take 1 tablet (10 mg total) by mouth daily. (Patient not taking: Reported on 02/18/2020) 90 tablet 3   No current facility-administered medications on file prior to visit.    PAST MEDICAL HISTORY: Past Medical History:  Diagnosis Date  . Atherosclerosis of aorta ([place])   . Benign hypertension   . Elevated lipoprotein A level   . Mixed hyperlipidemia     PAST SURGICAL HISTORY: Past Surgical History:  Procedure Laterality Date  . FINGER SURGERY    . REPAIR KNEE LIGAMENT      FAMILY HISTORY: The patient's family history includes Cancer in his father; Hyperlipidemia in his brother, brother, and brother; Hypertension in his mother and paternal grandfather; Kidney Stones in his brother.   SOCIAL HISTORY:  The patient  reports that he has never smoked. He has never used smokeless tobacco. He reports current alcohol use. He reports previous drug use.  Review of Systems  Constitution: Negative for chills and fever.  HENT: Negative for ear discharge, ear pain and nosebleeds.   Eyes: Negative for blurred vision and  discharge.  Cardiovascular: Negative for chest pain, claudication, dyspnea on exertion, leg swelling, near-syncope, orthopnea, palpitations, paroxysmal nocturnal dyspnea and syncope.  Respiratory: Negative for cough and shortness of breath.   Endocrine: Negative for polydipsia, polyphagia and polyuria.  Hematologic/Lymphatic: Negative for bleeding problem.  Skin: Negative for flushing and nail  changes.  Musculoskeletal: Negative for muscle cramps, muscle weakness and myalgias.  Gastrointestinal: Negative for abdominal pain, dysphagia, hematemesis, hematochezia, melena, nausea and vomiting.  Neurological: Negative for dizziness, focal weakness and light-headedness.   PHYSICAL EXAM: Vitals with BMI 02/18/2020 10/29/2016 05/06/2016  Height 5' 9"  5' 9"  -  Weight 185 lbs 174 lbs 8 oz -  BMI 12.75 17.0 -  Systolic 017 494 496  Diastolic 759 91 97  Pulse 62 75 62   Physical Exam  Constitutional: No distress.  Neck: No JVD present.  Cardiovascular: Normal rate, regular rhythm, normal heart sounds and intact distal pulses.  No murmur heard. Pulmonary/Chest: Effort normal and breath sounds normal. He has no wheezes. He has no rales.  Musculoskeletal:        General: No edema.  Vitals reviewed.  CARDIAC DATABASE: EKG: 02/18/2020: Normal sinus rhythm, 63 bpm, T wave inversions in the inferior leads and precordial leads, without evidence of myocardial injury pattern.  Similar findings on prior EKGs.  Echocardiogram: 04/2016: LVEF 16%, normal diastolic filling pressures, trace AR, trace MR, trace TR, no pulmonary hypertension.  Stress Testing:  05/13/2016: Myocardial perfusion imaging is normal.  LVEF by gated SPECT 61%, normal wall motion without high-grade segmental wall motion abnormalities.  Lower study.  Heart Catheterization: None  LABORATORY DATA: CBC Latest Ref Rng & Units 05/06/2016 05/06/2016  WBC 4.0 - 10.5 K/uL 6.0 6.1  Hemoglobin 13.0 - 17.0 g/dL 16.2 16.5  Hematocrit 39.0 - 52.0 % 46.7 45.3  Platelets 150 - 400 K/uL 208 -    CMP Latest Ref Rng & Units 05/06/2016 05/06/2016  Glucose 65 - 99 mg/dL 103(H) 97  BUN 6 - 20 mg/dL 10 12  Creatinine 0.61 - 1.24 mg/dL 0.99 1.01  Sodium 135 - 145 mmol/L 138 139  Potassium 3.5 - 5.1 mmol/L 3.9 4.9  Chloride 101 - 111 mmol/L 104 101  CO2 22 - 32 mmol/L 26 24  Calcium 8.9 - 10.3 mg/dL 9.3 9.5  Total Protein 6.1 - 8.1 g/dL -  7.4  Total Bilirubin 0.2 - 1.2 mg/dL - 0.7  Alkaline Phos 40 - 115 U/L - 71  AST 10 - 35 U/L - 28  ALT 9 - 46 U/L - 32   Lipid Panel     Component Value Date/Time   CHOL 275 (H) 05/06/2016 1119   TRIG 133 05/06/2016 1119   HDL 60 05/06/2016 1119   CHOLHDL 4.6 05/06/2016 1119   VLDL 27 05/06/2016 1119   LDLCALC 188 (H) 05/06/2016 1119    No results found for: HGBA1C No components found for: NTPROBNP No results found for: TSH  Cardiac Panel (last 3 results) No results for input(s): CKTOTAL, CKMB, TROPONINIHS, RELINDX in the last 72 hours.  FINAL MEDICATION LIST END OF ENCOUNTER: Meds ordered this encounter  Medications  . amLODipine (NORVASC) 5 MG tablet    Sig: Take 1 tablet (5 mg total) by mouth daily.    Dispense:  90 tablet    Refill:  0    Medications Discontinued During This Encounter  Medication Reason  . aspirin EC 81 MG tablet Completed Course  . oxyCODONE (ROXICODONE) 5 MG immediate release tablet Patient Preference  .  ranitidine (ZANTAC) 150 MG capsule Patient Preference  . amLODipine (NORVASC) 2.5 MG tablet Change in therapy     Current Outpatient Medications:  .  amLODipine (NORVASC) 5 MG tablet, Take 1 tablet (5 mg total) by mouth daily., Disp: 90 tablet, Rfl: 0 .  aspirin EC 325 MG tablet, Take 1 tablet (325 mg total) by mouth daily. (Patient not taking: Reported on 02/18/2020), Disp: 30 tablet, Rfl: 11 .  ibuprofen (ADVIL) 800 MG tablet, Take 1 tablet (800 mg total) by mouth every 8 (eight) hours as needed. (Patient not taking: Reported on 02/18/2020), Disp: 60 tablet, Rfl: 1 .  niacin 500 MG CR capsule, Take 500 mg by mouth at bedtime., Disp: , Rfl:  .  rosuvastatin (CRESTOR) 10 MG tablet, Take 1 tablet (10 mg total) by mouth daily. (Patient not taking: Reported on 02/18/2020), Disp: 90 tablet, Rfl: 3  IMPRESSION:    ICD-10-CM   1. Benign hypertension  I10 EKG 12-Lead    amLODipine (NORVASC) 5 MG tablet    PCV ECHOCARDIOGRAM COMPLETE  2. Mixed  hyperlipidemia  E78.2 CMP14+EGFR    Lipid Panel With LDL/HDL Ratio    Lipoprotein A (LPA)    Apo A1 + B + Ratio    LDL cholesterol, direct    High sensitivity CRP    High sensitivity CRP    LDL cholesterol, direct    Apo A1 + B + Ratio    Lipoprotein A (LPA)    Lipid Panel With LDL/HDL Ratio    CMP14+EGFR  3. Elevated Lp(a)  E78.41 CMP14+EGFR    Lipid Panel With LDL/HDL Ratio    Lipoprotein A (LPA)    Apo A1 + B + Ratio    LDL cholesterol, direct    High sensitivity CRP    High sensitivity CRP    LDL cholesterol, direct    Apo A1 + B + Ratio    Lipoprotein A (LPA)    Lipid Panel With LDL/HDL Ratio    CMP14+EGFR  4. Atherosclerosis of aorta (HCC)  I70.0      RECOMMENDATIONS: MADDIX HEINZ is a 64 y.o. male whose past medical history and cardiovascular risk factors include: Hyperlipidemia, history of elevated LP(a), hypertension.   Benign essential hypertension:  Patient's blood pressure at today's office visit is not at goal.  Patient states that his blood pressures at home are very well controlled.  Patient's wife is a nurse who states that his systolic blood pressures at home are consistently less than 140 mmHg.  I still have encouraged him to keep a log of his blood pressures in the morning and in the evening and to bring it in at the next office visit for review.  Low-salt diet recommended.  Patient is requesting a refill on his amlodipine, since he ran out of it.  Of note, patient has not been taking his medications for the last 1 year.  Mixed hyperlipidemia:  Check CMP to evaluate electrolytes and liver function.  Check fasting lipid profile with LP(a)  Depending on his lipid profile will initiate statin therapy.  Patient was originally on Crestor 10 mg p.o. nightly.  He used to be on niacin with aspirin for treatment of hypertriglyceridemia.  We discussed possibly initiating Vascepa if he does have hypertriglyceridemia and considering PCSK9 inhibitors  given his LP(a) levels in the past.    However, patient states that he does not have insurance and he is self-pay.  Also discussed about patient assistance program.  Echocardiogram will be ordered  to evaluate for structural heart disease and left ventricular systolic function.  Orders Placed This Encounter  Procedures  . CMP14+EGFR  . Lipid Panel With LDL/HDL Ratio  . Lipoprotein A (LPA)  . Apo A1 + B + Ratio  . LDL cholesterol, direct  . High sensitivity CRP  . EKG 12-Lead  . PCV ECHOCARDIOGRAM COMPLETE   --Continue cardiac medications as reconciled in final medication list. --Return in about 6 months (around 08/19/2020) for review test results, . Or sooner if needed. --Continue follow-up with your primary care physician regarding the management of your other chronic comorbid conditions.  Patient's questions and concerns were addressed to his satisfaction. He voices understanding of the instructions provided during this encounter.   During this visit I reviewed and updated: Tobacco history  allergies medication reconciliation  medical history  surgical history  family history  social history.  This note was created using a voice recognition software as a result there may be grammatical errors inadvertently enclosed that do not reflect the nature of this encounter. Every attempt is made to correct such errors.  Rex Kras, [place], [facility]  Pager: [phone] Office: [phone]

## 2020-02-19 ENCOUNTER — Encounter: Payer: Self-pay | Admitting: Cardiology

## 2020-03-01 ENCOUNTER — Other Ambulatory Visit: Payer: Self-pay

## 2020-03-08 ENCOUNTER — Other Ambulatory Visit: Payer: Self-pay

## 2020-03-22 ENCOUNTER — Ambulatory Visit: Payer: Self-pay | Admitting: Cardiology

## 2020-05-25 ENCOUNTER — Other Ambulatory Visit: Payer: Self-pay | Admitting: Cardiology

## 2020-05-25 DIAGNOSIS — I10 Essential (primary) hypertension: Secondary | ICD-10-CM
